# Patient Record
Sex: Male | Born: 1995
Health system: Southern US, Community
[De-identification: ages and names within clinical notes are randomized; demographics above are authoritative.]

---
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
## 2014-04-19 ENCOUNTER — Emergency Department (HOSPITAL_COMMUNITY)
Admission: EM | Admit: 2014-04-19 | Discharge: 2014-04-19 | Disposition: A | Discharge status: Home / Self Care | Payer: No Typology Code available for payment source | Attending: Emergency Medicine | Admitting: Emergency Medicine

## 2014-04-19 ENCOUNTER — Emergency Department (HOSPITAL_COMMUNITY): Payer: No Typology Code available for payment source

## 2014-04-19 ENCOUNTER — Encounter (HOSPITAL_COMMUNITY): Payer: Self-pay | Admitting: Emergency Medicine

## 2014-04-19 DIAGNOSIS — Y9389 Activity, other specified: Secondary | ICD-10-CM | POA: Insufficient documentation

## 2014-04-19 DIAGNOSIS — Z720 Tobacco use: Secondary | ICD-10-CM | POA: Diagnosis not present

## 2014-04-19 DIAGNOSIS — Y92410 Unspecified street and highway as the place of occurrence of the external cause: Secondary | ICD-10-CM | POA: Diagnosis not present

## 2014-04-19 DIAGNOSIS — S161XXA Strain of muscle, fascia and tendon at neck level, initial encounter: Secondary | ICD-10-CM | POA: Insufficient documentation

## 2014-04-19 DIAGNOSIS — Y998 Other external cause status: Secondary | ICD-10-CM | POA: Diagnosis not present

## 2014-04-19 DIAGNOSIS — Z79899 Other long term (current) drug therapy: Secondary | ICD-10-CM | POA: Insufficient documentation

## 2014-04-19 DIAGNOSIS — S199XXA Unspecified injury of neck, initial encounter: Secondary | ICD-10-CM | POA: Diagnosis present

## 2014-04-19 DIAGNOSIS — S161XXD Strain of muscle, fascia and tendon at neck level, subsequent encounter: Secondary | ICD-10-CM

## 2014-04-19 DIAGNOSIS — M542 Cervicalgia: Secondary | ICD-10-CM

## 2014-04-19 MED ORDER — IBUPROFEN 800 MG PO TABS
800.0000 mg | ORAL_TABLET | Freq: Once | ORAL | Status: AC
  Administered 2014-04-19: 800 mg via ORAL
  Filled 2014-04-19: qty 1

## 2014-04-19 MED ORDER — METHOCARBAMOL 500 MG PO TABS: 500.0000 mg | ORAL_TABLET | Freq: Two times a day (BID) | ORAL | Status: DC

## 2014-04-19 MED ORDER — IBUPROFEN 800 MG PO TABS: 800.0000 mg | ORAL_TABLET | Freq: Three times a day (TID) | ORAL | Status: AC

## 2014-04-19 MED ORDER — IBUPROFEN 800 MG PO TABS: 800.0000 mg | ORAL_TABLET | Freq: Three times a day (TID) | ORAL | Status: DC

## 2014-04-19 MED ORDER — METHOCARBAMOL 500 MG PO TABS
500.0000 mg | ORAL_TABLET | Freq: Once | ORAL | Status: AC
  Administered 2014-04-19: 500 mg via ORAL
  Filled 2014-04-19: qty 1
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
## 2014-04-19 NOTE — Discharge Instructions (Signed)
1. Medications: robaxin, ibuprofen, usual home medications 2. Treatment: rest, drink plenty of fluids, gentle stretching as discussed, alternate ice and heat 3. Follow Up: Please followup with your primary doctor in 3 days for discussion of your diagnoses and further evaluation after today's visit; if you do not have a primary care doctor use the resource guide provided to find one;  Return to the ER for worsening back or neck pain, difficulty walking, loss of bowel or bladder control or other concerning symptoms    Cervical Sprain A cervical sprain is an injury in the neck in which the strong, fibrous tissues (ligaments) that connect your neck bones stretch or tear. Cervical sprains can range from mild to severe. Severe cervical sprains can cause the neck vertebrae to be unstable. This can lead to damage of the spinal cord and can result in serious nervous system problems. The amount of time it takes for a cervical sprain to get better depends on the cause and extent of the injury. Most cervical sprains heal in 1 to 3 weeks. CAUSES  Severe cervical sprains may be caused by:   Contact sport injuries (such as from football, rugby, wrestling, hockey, auto racing, gymnastics, diving, martial arts, or boxing).   Motor vehicle collisions.   Whiplash injuries. This is an injury from a sudden forward and backward whipping movement of the head and neck.  Falls.  Mild cervical sprains may be caused by:   Being in an awkward position, such as while cradling a telephone between your ear and shoulder.   Sitting in a chair that does not offer proper support.   Working at a poorly Marketing executivedesigned computer station.   Looking up or down for long periods of time.  SYMPTOMS   Pain, soreness, stiffness, or a burning sensation in the front, back, or sides of the neck. This discomfort may develop immediately after the injury or slowly, 24 hours or more after the injury.   Pain or tenderness directly in  the middle of the back of the neck.   Shoulder or upper back pain.   Limited ability to move the neck.   Headache.   Dizziness.   Weakness, numbness, or tingling in the hands or arms.   Muscle spasms.   Difficulty swallowing or chewing.   Tenderness and swelling of the neck.  DIAGNOSIS  Most of the time your health care provider can diagnose a cervical sprain by taking your history and doing a physical exam. Your health care provider will ask about previous neck injuries and any known neck problems, such as arthritis in the neck. X-rays may be taken to find out if there are any other problems, such as with the bones of the neck. Other tests, such as a CT scan or MRI, may also be needed.  TREATMENT  Treatment depends on the severity of the cervical sprain. Mild sprains can be treated with rest, keeping the neck in place (immobilization), and pain medicines. Severe cervical sprains are immediately immobilized. Further treatment is done to help with pain, muscle spasms, and other symptoms and may include:  Medicines, such as pain relievers, numbing medicines, or muscle relaxants.   Physical therapy. This may involve stretching exercises, strengthening exercises, and posture training. Exercises and improved posture can help stabilize the neck, strengthen muscles, and help stop symptoms from returning.  HOME CARE INSTRUCTIONS   Put ice on the injured area.   Put ice in a plastic bag.   Place a towel between your skin and  the bag.   Leave the ice on for 15-20 minutes, 3-4 times a day.   If your injury was severe, you may have been given a cervical collar to wear. A cervical collar is a two-piece collar designed to keep your neck from moving while it heals.  Do not remove the collar unless instructed by your health care provider.  If you have long hair, keep it outside of the collar.  Ask your health care provider before making any adjustments to your collar. Minor  adjustments may be required over time to improve comfort and reduce pressure on your chin or on the back of your head.  Ifyou are allowed to remove the collar for cleaning or bathing, follow your health care provider's instructions on how to do so safely.  Keep your collar clean by wiping it with mild soap and water and drying it completely. If the collar you have been given includes removable pads, remove them every 1-2 days and hand wash them with soap and water. Allow them to air dry. They should be completely dry before you wear them in the collar.  If you are allowed to remove the collar for cleaning and bathing, wash and dry the skin of your neck. Check your skin for irritation or sores. If you see any, tell your health care provider.  Do not drive while wearing the collar.   Only take over-the-counter or prescription medicines for pain, discomfort, or fever as directed by your health care provider.   Keep all follow-up appointments as directed by your health care provider.   Keep all physical therapy appointments as directed by your health care provider.   Make any needed adjustments to your workstation to promote good posture.   Avoid positions and activities that make your symptoms worse.   Warm up and stretch before being active to help prevent problems.  SEEK MEDICAL CARE IF:   Your pain is not controlled with medicine.   You are unable to decrease your pain medicine over time as planned.   Your activity level is not improving as expected.  SEEK IMMEDIATE MEDICAL CARE IF:   You develop any bleeding.  You develop stomach upset.  You have signs of an allergic reaction to your medicine.   Your symptoms get worse.   You develop new, unexplained symptoms.   You have numbness, tingling, weakness, or paralysis in any part of your body.  MAKE SURE YOU:   Understand these instructions.  Will watch your condition.  Will get help right away if you are not  doing well or get worse. Document Released: 02/04/2007 Document Revised: 04/14/2013 Document Reviewed: 10/15/2012 [facility] Patient Information 2015 [place]. This information is not intended to replace advice given to you by your health care provider. Make sure you discuss any questions you have with your health care provider.
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
## 2014-11-01 ENCOUNTER — Encounter: Payer: Self-pay | Admitting: Family Medicine

## 2014-11-01 ENCOUNTER — Ambulatory Visit (INDEPENDENT_AMBULATORY_CARE_PROVIDER_SITE_OTHER): Payer: 59 | Admitting: Family Medicine

## 2014-11-01 VITALS — BP 110/68 | Ht 74.0 in | Wt 252.0 lb

## 2014-11-01 DIAGNOSIS — R252 Cramp and spasm: Secondary | ICD-10-CM | POA: Diagnosis not present

## 2014-11-01 DIAGNOSIS — M791 Myalgia: Secondary | ICD-10-CM | POA: Diagnosis not present

## 2014-11-01 DIAGNOSIS — M609 Myositis, unspecified: Secondary | ICD-10-CM | POA: Diagnosis not present

## 2014-11-01 DIAGNOSIS — IMO0001 Reserved for inherently not codable concepts without codable children: Secondary | ICD-10-CM

## 2014-11-01 NOTE — Progress Notes (Signed)
   Subjective:    Patient ID: Hector MercuryDevonte R Egloff, male    DOB: [birth year], 19 y.o.   MRN: [id number]  HPI  Patient arrives with complaint of severe leg cramps for last week. Patient is active and lifts weights. occas ibuprofen Started  A few weeks ago Happens both upper and lower legs 3 times a week lifts Works at [employer] lifts up to 4 to 5 hours a day  Been avoiding soda Gets some veggies an fruits  Review of Systems     Objective:   Physical Exam   lungs clear heart regular pulses in the feet are normal. Calf muscles normal     Assessment & Plan:   I believe this patient is having muscle cramps increase Y intake especially on today's works. Check lab work. Should gradually do better if he keeps electrolytes and not hydration well.

## 2014-11-02 LAB — BASIC METABOLIC PANEL
BUN/Creatinine Ratio: 9 (ref 8–19)
BUN: 11 mg/dL (ref 6–20)
CO2: 24 mmol/L (ref 18–29)
Calcium: 9.5 mg/dL (ref 8.7–10.2)
Chloride: 101 mmol/L (ref 97–108)
Creatinine, Ser: 1.16 mg/dL (ref 0.76–1.27)
GFR calc Af Amer: 103 mL/min/{1.73_m2} (ref >59)
GFR calc non Af Amer: 90 mL/min/{1.73_m2} (ref >59)
Glucose: 112 mg/dL — ABNORMAL HIGH (ref 65–99)
Potassium: 4.2 mmol/L (ref 3.5–5.2)
Sodium: 142 mmol/L (ref 134–144)

## 2014-11-02 LAB — TSH: TSH: 1.64 u[IU]/mL (ref 0.450–4.500)

## 2014-11-02 LAB — MAGNESIUM: Magnesium: 1.9 mg/dL (ref 1.6–2.3)

## 2014-11-08 ENCOUNTER — Encounter: Payer: Self-pay | Admitting: Family Medicine

## 2015-06-30 ENCOUNTER — Ambulatory Visit (INDEPENDENT_AMBULATORY_CARE_PROVIDER_SITE_OTHER): Payer: 59 | Admitting: Family Medicine

## 2015-06-30 ENCOUNTER — Ambulatory Visit (HOSPITAL_COMMUNITY)
Admission: RE | Admit: 2015-06-30 | Discharge: 2015-06-30 | Disposition: A | Discharge status: Home / Self Care | Payer: 59 | Source: Ambulatory Visit | Attending: Family Medicine | Admitting: Family Medicine

## 2015-06-30 ENCOUNTER — Encounter: Payer: Self-pay | Admitting: Family Medicine

## 2015-06-30 VITALS — BP 112/70 | Ht 74.0 in | Wt 227.1 lb

## 2015-06-30 DIAGNOSIS — M79642 Pain in left hand: Secondary | ICD-10-CM | POA: Diagnosis present

## 2015-06-30 DIAGNOSIS — S60042A Contusion of left ring finger without damage to nail, initial encounter: Secondary | ICD-10-CM | POA: Insufficient documentation

## 2015-06-30 NOTE — Progress Notes (Signed)
   Subjective:    Patient ID: Hector MercuryDevonte R Lares, male    DOB: [birth year], 20 y.o.   MRN: [id number]  Hand Pain  The incident occurred more than 1 week ago. The pain is present in the left fingers. The quality of the pain is described as aching. The pain does not radiate. The pain is moderate. The pain has been intermittent since the incident. The symptoms are aggravated by movement. He has tried nothing for the symptoms. The treatment provided no relief.   patient playing basketball room relates he jammed the hand has pain and discomfort in the fifth MCP of the left hand. Denies any other particular troubles unable to make a fist.    Review of Systems     See above relates pain discomfort swelling tenderness inability make fist hard to use the hand Objective:   Physical Exam  tenderness pain discomfort fifth MCP decreased range of motion. Rest of exam normal.       Assessment & Plan:   x-ray pending possible fracture possibly torn ligament may need orthopedics  Patient trying to work currently but difficult because of his hand

## 2015-07-01 NOTE — Addendum Note (Signed)
Addended by: Margaretha SheffieldBROWN, Emmarie Sannes S on: 07/01/2015 08:41 AM   Modules accepted: Orders

## 2015-07-11 ENCOUNTER — Ambulatory Visit (INDEPENDENT_AMBULATORY_CARE_PROVIDER_SITE_OTHER): Payer: 59 | Admitting: Orthopedic Surgery

## 2015-07-11 ENCOUNTER — Encounter: Payer: Self-pay | Admitting: Orthopedic Surgery

## 2015-07-11 VITALS — BP 120/58 | Ht 74.0 in | Wt 227.0 lb

## 2015-07-11 DIAGNOSIS — S6992XA Unspecified injury of left wrist, hand and finger(s), initial encounter: Secondary | ICD-10-CM

## 2015-07-11 NOTE — Progress Notes (Signed)
Chief Complaint  Patient presents with  . Hand Injury    left ring finger basketball injury few weeks old    HPI in late February this 20 year old male injured his left small finger playing basketball he thought it was just jammed. He woke up after playing with trouble flexing his finger. He went to the ER in early March x-ray shows soft tissue swelling no fracture he presents with pain in the palm of his hand near the A1 pulley of the left small finger with weakness flexing the DIP joint  Review of Systems  All other systems reviewed and are negative.   No past medical history on file.  No past surgical history on file. No family history on file. Social History  Substance Use Topics  . Smoking status: Current Every Day Smoker    Types: Cigars  . Smokeless tobacco: None  . Alcohol Use: No    Current outpatient prescriptions:  .  ibuprofen (ADVIL,MOTRIN) 800 MG tablet, Take 1 tablet (800 mg total) by mouth 3 (three) times daily. (Patient not taking: Reported on 06/30/2015), Disp: 21 tablet, Rfl: 0  BP 120/58 mmHg  Ht 6\' 2"  (1.88 m)  Wt 227 lb (102.967 kg)  BMI 29.13 kg/m2  Physical Exam  Constitutional: He is oriented to person, place, and time. He appears well-developed and well-nourished. No distress.  Cardiovascular: Normal rate and intact distal pulses.   Neurological: He is alert and oriented to person, place, and time.  Skin: Skin is warm and dry. No rash noted. He is not diaphoretic. No erythema. No pallor.  Psychiatric: He has a normal mood and affect. His behavior is normal. Judgment and thought content normal.   left small finger is tender over the A1 pulley has weakness of the PIP especially DIP joint when compared to the opposite joint. However there is some flexion there so it is questionable whether he has a partial full flexor tendon tear or tenosynovitis there is no redness erythema or color changes temperature of the finger is normal  Ortho  Exam   ASSESSMENT: My personal interpretation of the images:  The x-rays were reviewed and they are negative    PLAN We will make a referral to a hand specialist just to be sure there is no flexor tendon injury.

## 2015-07-11 NOTE — Addendum Note (Signed)
Addended by: Diamantina MonksBOOTHE, Desire Fulp B on: 07/11/2015 06:28 PM   Modules accepted: Orders

## 2015-07-11 NOTE — Patient Instructions (Signed)
We will refer you to hand specialist Dr Xu 

## 2015-07-12 ENCOUNTER — Telehealth: Payer: Self-pay | Admitting: *Deleted

## 2015-07-12 NOTE — Telephone Encounter (Signed)
Referral faxed to [facility] for Dr Thurston HoleWainer

## 2015-07-20 NOTE — Telephone Encounter (Signed)
Patient has an appointment with DR.Xu 07/29/15 at 1:30 pm. Left message on patient's voicemail regarding appointment date and time.

## 2017-02-20 ENCOUNTER — Telehealth: Payer: Self-pay | Admitting: Family Medicine

## 2017-02-20 NOTE — Telephone Encounter (Signed)
Patient dropped off form he is hoping that Dr. Lorin PicketScott will complete for his job.  He has a skin condition that causes him not to be able to shave.  His job is requiring this form to be completed to have a variance from the work standard.    He was referred to Dr. Margo AyeHall by our office for this skin condition, but Dr. Scharlene GlossHall's office is refusing to complete the form because of a balance owed.  Patient is hoping that Dr. Lorin PicketScott will complete the form since he originally started seeing him for it.  See in yellow basket.

## 2017-02-22 NOTE — Telephone Encounter (Signed)
Please verify with the patient his skin condition-I assume the patient gets a lot of bumps where he shaves sometimes scar tissue.  This is known as pseudofolliculitis barbae-I filled out the form accordingly please put our office stamp phone number etc. on it and forward to the patient

## 2017-02-22 NOTE — Telephone Encounter (Signed)
I called and left a vm,asked that he r/c. 

## 2017-02-22 NOTE — Telephone Encounter (Signed)
Discussed with pt. And the form is for pseudofolliculitis. Pt notified form ready for pickup. And copy scanned into the chart.

## 2017-03-25 DIAGNOSIS — S0501XA Injury of conjunctiva and corneal abrasion without foreign body, right eye, initial encounter: Secondary | ICD-10-CM | POA: Diagnosis not present

## 2018-02-05 ENCOUNTER — Telehealth: Payer: Self-pay | Admitting: Family Medicine

## 2018-02-05 DIAGNOSIS — Z0000 Encounter for general adult medical examination without abnormal findings: Secondary | ICD-10-CM

## 2018-02-05 NOTE — Telephone Encounter (Signed)
Last labs 11/01/14 tsh, magnessium, bmp

## 2018-02-05 NOTE — Telephone Encounter (Signed)
Patient has physical on 11/19 and needing lab paper.

## 2018-02-06 NOTE — Telephone Encounter (Signed)
Orders put in. Left message to return call  

## 2018-02-06 NOTE — Telephone Encounter (Signed)
Error on message below. Was not able to leave message his voicemail was full.

## 2018-02-06 NOTE — Telephone Encounter (Signed)
I would recommend lipid, liver, metabolic 7

## 2018-02-12 NOTE — Telephone Encounter (Signed)
Tried to contact patient. Pt voicemail is full; unable to leave message 

## 2018-02-14 NOTE — Telephone Encounter (Signed)
Tried to call no answer

## 2018-02-17 NOTE — Telephone Encounter (Signed)
Tried to call no answer

## 2018-02-18 NOTE — Telephone Encounter (Signed)
Cannot get in touch with pt. Will give labs orders at appt in november

## 2018-03-11 ENCOUNTER — Ambulatory Visit (INDEPENDENT_AMBULATORY_CARE_PROVIDER_SITE_OTHER): Payer: 59 | Admitting: Family Medicine

## 2018-03-11 ENCOUNTER — Encounter: Payer: Self-pay | Admitting: Family Medicine

## 2018-03-11 VITALS — BP 118/76 | Ht 71.5 in | Wt 226.6 lb

## 2018-03-11 DIAGNOSIS — N529 Male erectile dysfunction, unspecified: Secondary | ICD-10-CM

## 2018-03-11 DIAGNOSIS — R5383 Other fatigue: Secondary | ICD-10-CM | POA: Diagnosis not present

## 2018-03-11 DIAGNOSIS — Z0000 Encounter for general adult medical examination without abnormal findings: Secondary | ICD-10-CM

## 2018-03-11 DIAGNOSIS — G4700 Insomnia, unspecified: Secondary | ICD-10-CM | POA: Diagnosis not present

## 2018-03-11 DIAGNOSIS — Z131 Encounter for screening for diabetes mellitus: Secondary | ICD-10-CM

## 2018-03-11 DIAGNOSIS — Z13220 Encounter for screening for lipoid disorders: Secondary | ICD-10-CM

## 2018-03-11 NOTE — Progress Notes (Signed)
Subjective:    Patient ID: Hector Gutierrez, male    DOB: [birth year], 22 y.o.   MRN: [id number]  HPI  The patient comes in today for a wellness visit. The patient states he tries to eat relatively healthy He works at [employer] does a lot of lifting and moving.  He states he is under a fair amount of stress at times he over thinks things and gets stressed out about stuff.  He denies being depressed.  He does state though that it is difficult for him at times to go to sleep and other times he states is difficult for him to maintain an erection.   A review of their health history was completed.  A review of medications was also completed.  Any needed refills; no  Eating habits: tryin to eat healthy  Falls/  MVA accidents in past few months: none  Regular exercise: yes  Specialist pt sees on regular basis: none  Preventative health issues were discussed.   Additional concerns: none    Review of Systems  Constitutional: Negative for activity change, appetite change and fever.  HENT: Negative for congestion and rhinorrhea.   Eyes: Negative for discharge.  Respiratory: Negative for cough and wheezing.   Cardiovascular: Negative for chest pain.  Gastrointestinal: Negative for abdominal pain, blood in stool and vomiting.  Genitourinary: Negative for difficulty urinating and frequency.  Musculoskeletal: Negative for neck pain.  Skin: Negative for rash.  Allergic/Immunologic: Negative for environmental allergies and food allergies.  Neurological: Negative for weakness and headaches.  Psychiatric/Behavioral: Negative for agitation.       Objective:   Physical Exam  Constitutional: He appears well-developed and well-nourished.  HENT:  Head: Normocephalic and atraumatic.  Right Ear: External ear normal.  Left Ear: External ear normal.  Nose: Nose normal.  Mouth/Throat: Oropharynx is clear and moist.  Eyes: Pupils are equal, round, and reactive to light. EOM are normal.  Neck:  Normal range of motion. Neck supple. No thyromegaly present.  Cardiovascular: Normal rate, regular rhythm and normal heart sounds.  No murmur heard. Pulmonary/Chest: Effort normal and breath sounds normal. No respiratory distress. He has no wheezes.  Abdominal: Soft. Bowel sounds are normal. He exhibits no distension and no mass. There is no tenderness.  Genitourinary: Penis normal.  Musculoskeletal: Normal range of motion. He exhibits no edema.  Lymphadenopathy:    He has no cervical adenopathy.  Neurological: He is alert. He exhibits normal muscle tone.  Skin: Skin is warm and dry. No erythema.  Psychiatric: He has a normal mood and affect. His behavior is normal. Judgment normal.          Assessment & Plan:  Adult wellness-complete.wellness physical was conducted today. Importance of diet and exercise were discussed in detail.  In addition to this a discussion regarding safety was also covered. We also reviewed over immunizations and gave recommendations regarding current immunization needed for age.  In addition to this additional areas were also touched on including: Preventative health exams needed:  Colonoscopy he is not indicated for this or rectal exam at this point  Patient was advised yearly wellness exam  Patient under a significant amount of stress will do his best to try to work hard with healthy habits and try to relax his best possible we will send him some information including counselors he will consider doing some counseling if he needs her help he will let us know Patient not depressed or suicidal  Patient does have some mild erectile  dysfunction intermittent probably related to excessive work plus stress plus worry patient will try relaxation techniques we will check testosterone if ongoing trouble he will notify us  Wellness exam in 1 year
